# Patient Record
Sex: Female | Born: 1995 | Race: White | Hispanic: No | Marital: Married
Health system: Southern US, Community
[De-identification: ages and names within clinical notes are randomized; demographics above are authoritative.]

## PROBLEM LIST (undated history)

## (undated) DIAGNOSIS — E669 Obesity, unspecified: Secondary | ICD-10-CM

## (undated) HISTORY — DX: Obesity, unspecified: E66.9

## (undated) HISTORY — PX: TONSILLECTOMY AND ADENOIDECTOMY: SHX28

---
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
## 2023-01-27 DIAGNOSIS — Z0000 Encounter for general adult medical examination without abnormal findings: Secondary | ICD-10-CM | POA: Diagnosis not present

## 2023-01-27 DIAGNOSIS — Z1389 Encounter for screening for other disorder: Secondary | ICD-10-CM | POA: Diagnosis not present

## 2023-01-27 DIAGNOSIS — I10 Essential (primary) hypertension: Secondary | ICD-10-CM | POA: Diagnosis not present

## 2023-01-27 DIAGNOSIS — Z124 Encounter for screening for malignant neoplasm of cervix: Secondary | ICD-10-CM | POA: Diagnosis not present

## 2023-01-27 DIAGNOSIS — Z79899 Other long term (current) drug therapy: Secondary | ICD-10-CM | POA: Diagnosis not present

## 2023-01-27 DIAGNOSIS — F411 Generalized anxiety disorder: Secondary | ICD-10-CM | POA: Diagnosis not present

## 2023-01-27 DIAGNOSIS — Z6841 Body Mass Index (BMI) 40.0 and over, adult: Secondary | ICD-10-CM | POA: Diagnosis not present

## 2023-01-27 DIAGNOSIS — Z1331 Encounter for screening for depression: Secondary | ICD-10-CM | POA: Diagnosis not present

## 2023-01-27 DIAGNOSIS — F339 Major depressive disorder, recurrent, unspecified: Secondary | ICD-10-CM | POA: Diagnosis not present

## 2023-04-30 DIAGNOSIS — E559 Vitamin D deficiency, unspecified: Secondary | ICD-10-CM | POA: Diagnosis not present

## 2023-04-30 DIAGNOSIS — E785 Hyperlipidemia, unspecified: Secondary | ICD-10-CM | POA: Diagnosis not present

## 2023-04-30 DIAGNOSIS — Z6841 Body Mass Index (BMI) 40.0 and over, adult: Secondary | ICD-10-CM | POA: Diagnosis not present

## 2023-04-30 DIAGNOSIS — F411 Generalized anxiety disorder: Secondary | ICD-10-CM | POA: Diagnosis not present

## 2023-04-30 DIAGNOSIS — F339 Major depressive disorder, recurrent, unspecified: Secondary | ICD-10-CM | POA: Diagnosis not present

## 2023-04-30 DIAGNOSIS — I10 Essential (primary) hypertension: Secondary | ICD-10-CM | POA: Diagnosis not present

## 2023-04-30 DIAGNOSIS — Z79899 Other long term (current) drug therapy: Secondary | ICD-10-CM | POA: Diagnosis not present

## 2023-04-30 DIAGNOSIS — D519 Vitamin B12 deficiency anemia, unspecified: Secondary | ICD-10-CM | POA: Diagnosis not present

## 2023-05-31 DIAGNOSIS — Z888 Allergy status to other drugs, medicaments and biological substances status: Secondary | ICD-10-CM | POA: Diagnosis not present

## 2023-05-31 DIAGNOSIS — R799 Abnormal finding of blood chemistry, unspecified: Secondary | ICD-10-CM | POA: Diagnosis not present

## 2023-05-31 DIAGNOSIS — E785 Hyperlipidemia, unspecified: Secondary | ICD-10-CM | POA: Diagnosis not present

## 2023-05-31 DIAGNOSIS — I10 Essential (primary) hypertension: Secondary | ICD-10-CM | POA: Diagnosis not present

## 2023-05-31 DIAGNOSIS — E559 Vitamin D deficiency, unspecified: Secondary | ICD-10-CM | POA: Diagnosis not present

## 2023-05-31 DIAGNOSIS — D519 Vitamin B12 deficiency anemia, unspecified: Secondary | ICD-10-CM | POA: Diagnosis not present

## 2023-05-31 DIAGNOSIS — Z9109 Other allergy status, other than to drugs and biological substances: Secondary | ICD-10-CM | POA: Diagnosis not present

## 2023-05-31 DIAGNOSIS — J309 Allergic rhinitis, unspecified: Secondary | ICD-10-CM | POA: Diagnosis not present

## 2023-05-31 DIAGNOSIS — Z79899 Other long term (current) drug therapy: Secondary | ICD-10-CM | POA: Diagnosis not present

## 2023-05-31 DIAGNOSIS — F339 Major depressive disorder, recurrent, unspecified: Secondary | ICD-10-CM | POA: Diagnosis not present

## 2023-05-31 DIAGNOSIS — F411 Generalized anxiety disorder: Secondary | ICD-10-CM | POA: Diagnosis not present
